# Patient Record
Sex: Female | Born: 1995 | Race: Black or African American | Hispanic: No | Marital: Single
Health system: Midwestern US, Academic
[De-identification: ages and names within clinical notes are randomized; demographics above are authoritative.]

---

## 2016-10-05 ENCOUNTER — Institutional Professional Consult (permissible substitution): Admit: 2016-10-05 | Payer: PRIVATE HEALTH INSURANCE

## 2016-10-05 DIAGNOSIS — Z9229 Personal history of other drug therapy: Secondary | ICD-10-CM

## 2016-10-27 ENCOUNTER — Ambulatory Visit: Admit: 2016-10-27 | Payer: PRIVATE HEALTH INSURANCE

## 2016-10-27 DIAGNOSIS — N926 Irregular menstruation, unspecified: Secondary | ICD-10-CM

## 2016-10-27 LAB — CHLAMYDIA/GONORRHOEAE DNA THIN PREP
Chlamydia Trachomatis DNA: NEGATIVE
Neisseria Gonorrhoeae DNA: NEGATIVE

## 2016-10-27 MED ORDER — loratadine (CLARITIN) 10 mg tablet
10 | ORAL_TABLET | Freq: Every day | ORAL | 5 refills | Status: AC
Start: 2016-10-27 — End: 2017-10-27

## 2016-10-27 MED ORDER — loratadine (CLARITIN) 10 mg tablet
10 | ORAL_TABLET | Freq: Every day | ORAL | 5 refills | Status: AC | PRN
Start: 2016-10-27 — End: 2016-10-27
  Filled 2016-10-27: qty 30, 30d supply, fill #0

## 2016-10-27 NOTE — Progress Notes (Signed)
WOMEN'S HEALTH ANNUAL EXAM     INITIAL ASSESSMENT      Chief Complaint   Patient presents with    Gynecologic Exam   :Here for annual gyn exam . Irregular periods . Missed period  Sexually active . Uses condoms most of the time . Has nasal stuffiness most of the time  No fever , No cough . No headache, no sinus tenderness. H/O anemia in the past    Nursing Assessment/Comment:    VITALS    BP 109/79   Pulse 75   Ht 5' 4 (1.626 m)   Wt 166 lb (75.3 kg)   LMP 09/20/2016   Social History   Substance Use Topics    Smoking status: Never Smoker    Smokeless tobacco: Never Used    Alcohol use Yes         SUBJECTIVE     Patient's last menstrual period was 09/20/2016.  Last PAP: ? 1  1/2 years ago normal   Menses Pattern: irregular    Usual Duration (days): 7    Yes [x]  No []   History of menorrhagia  Yes []  No [x]   History of intermenstrual spotting  Yes []  No [x]   History of vagina discharge  Yes [x]  No []   History of history of sexual activity     History of OCP use? no  Current contraception method(s):condoms    Breast Self-Exams:No    Yes [x]  No []   History of abdominal pain  Yes []  No [x]   History of urinary symptoms  Yes []  No [x]   History of vaginal irritation  Yes []  No [x]   History of pain with intercourse  Yes []  No [x]   History of history of colposcopy  Yes []  No [x]   History of family history of breast cancer  Yes []  No [x]   History of history of ovarian cancer  Yes []  No [x]   History of family history of blood clots  Yes []  No [x]   History of migraines with aura  Yes []  No [x]   History of migraines without aura    OBJECTIVE     Physical Exam  All Normal  GENERAL  [x]   Well-appearing      HEENT   Eyes:   [x]  anicteric [x]  normal pink conjunctiva .swollen nasal turbinates. Np sinus tenderness TMs clear .    Oropharynx:  [x]  clear      NECK  [x]  no lymphadenopathy [x]  no thyromegaly [x]  no masses   [x]  supple      CARDIAC  [x]  regular rate and rhythm without murmur      LUNGS  [x]  clear      ABDOMEN  [x]   soft; nontender; no hepatosplenomegaly; no masses      BREASTS  [x]  no masses, axillary lymphadenopathy, nipple discharge, or skin changes      EXTERNAL GENITALIA   [x]  normal external female genitalia without lesion      VAGINA  [x]  normal-appearing rugated vault without bleeding or unusual discharge present  []  Discharge  []  Malodor      CERVIX  [x]  Normal-appearing cervix without lesion  []  Friable  []  Ectropion  []  Polyp      BIMANUAL  Uterus: [x]   [place]; normal size and contour without tenderness. No CMT  Adnexae: []   Nontender; no masses; no CMT    []   Ovaries palpable with normal size and contour      RECTAL     []   No masses  []   No perianal lesion  Heme test:  []       SKIN  []  no rashes      Urine HCG negative    ASSESSMENT     1. Missed period  POC urine pregnancy    HCG Serum Qualitative [facility] Only   2. Encounter for annual routine gynecological examination  CBC    Comprehensive Metabolic Panel, Serum    TSH (Thyroid Stimulating Hormone)    Lipid Profile    Cytology - PAP Test, Routine Scrn    Iron Studies (Iron + TIBC)   3. Chronic seasonal allergic rhinitis due to other allergen  loratadine (CLARITIN) 10 mg tablet    DISCONTINUED: loratadine (CLARITIN) 10 mg tablet           PLAN     [x]  Encouraged monthly self-breast exams    []  Discussed calcium intake    []  Advised regular calcium intake    []  Advised regular exercise routine    [x]  Encouraged safe sex. Discussed at length, Offered BCpills . Pt deferred . Discussed Plan B . Discussed IUD and referral to Dr Loma Boston.  []  Contraception pamphlet given    []  Encouraged tobacco cessation    [x]  Discussed Gardasil vaccine. Pt has not had the vaccine, Wants to think about it      Return for fasting labs     Discussed provera for 10 days if serum pregnancy test was negative.

## 2016-11-02 ENCOUNTER — Ambulatory Visit: Admit: 2016-11-03 | Payer: PRIVATE HEALTH INSURANCE

## 2016-11-02 ENCOUNTER — Institutional Professional Consult (permissible substitution): Admit: 2016-11-02 | Payer: PRIVATE HEALTH INSURANCE

## 2016-11-02 DIAGNOSIS — Z01419 Encounter for gynecological examination (general) (routine) without abnormal findings: Secondary | ICD-10-CM

## 2016-11-02 LAB — CBC
Hematocrit: 41.8 % (ref 35.0–45.0)
Hemoglobin: 13.8 g/dL (ref 11.7–15.5)
MCH: 26.9 pg — ABNORMAL LOW (ref 27.0–33.0)
MCHC: 32.9 g/dL (ref 32.0–36.0)
MCV: 81.8 fL (ref 80.0–100.0)
MPV: 9.8 fL (ref 7.5–11.5)
Platelets: 206 10*3/uL (ref 140–400)
RBC: 5.11 10*6/uL — ABNORMAL HIGH (ref 3.80–5.10)
RDW: 15.4 % — ABNORMAL HIGH (ref 11.0–15.0)
WBC: 5.2 10*3/uL (ref 3.8–10.8)

## 2016-11-02 LAB — COMPREHENSIVE METABOLIC PANEL, SERUM
ALT: 17 U/L (ref 7–52)
AST (SGOT): 24 U/L (ref 13–39)
Albumin: 4.3 g/dL (ref 3.5–5.7)
Alkaline Phosphatase: 106 U/L (ref 36–125)
Anion Gap: 7 mmol/L (ref 3–16)
BUN: 12 mg/dL (ref 7–25)
CO2: 26 mmol/L (ref 21–33)
Calcium: 9.3 mg/dL (ref 8.6–10.3)
Chloride: 106 mmol/L (ref 98–110)
Creatinine: 0.96 mg/dL (ref 0.60–1.30)
Glucose: 85 mg/dL (ref 70–100)
Osmolality, Calculated: 287 mosm/kg (ref 278–305)
Potassium: 3.9 mmol/L (ref 3.5–5.3)
Sodium: 139 mmol/L (ref 133–146)
Total Bilirubin: 0.2 mg/dL (ref 0.0–1.5)
Total Protein: 7.3 g/dL (ref 6.4–8.9)
eGFR AA CKD-EPI: >90 See note.
eGFR NONAA CKD-EPI: 84 See note.

## 2016-11-02 LAB — LIPID PANEL
Cholesterol, Total: 137 mg/dL (ref 0–200)
HDL: 65 mg/dL (ref 60–92)
LDL Cholesterol: 66 mg/dL
Triglycerides: 32 mg/dL (ref 10–149)

## 2016-11-02 LAB — HCG, SERUM, QUALITATIVE: Preg, Serum: NEGATIVE

## 2016-11-02 LAB — IRON STUDIES
% Iron Saturation: 11.7 % — ABNORMAL LOW (ref 15.0–55.0)
Iron: 52 ug/dL (ref 50–212)
TIBC: 444 ug/dL (ref 265–497)

## 2016-11-02 LAB — TSH: TSH: 0.97 u[IU]/mL (ref 0.34–5.60)

## 2016-11-02 NOTE — Progress Notes (Signed)
Reports for fasting labs.

## 2016-11-03 NOTE — Progress Notes (Signed)
Iron low H and H normal

## 2016-11-12 NOTE — Unmapped (Signed)
From: Juliane Lack  Sent: 11/11/2016 2:15 PM EST  To: Mychart Error Pool  Subject: Hepatits B titer     Topic: Referral    Hello,    I am not sure who specifically to message but I put in an order for a Hepatitis B Titer for the same day that I did all of my other bloodwork and I don't seem to see those results (or don't realize which ones those are), how long does it take for it to come in?    Thank you

## 2016-12-04 ENCOUNTER — Institutional Professional Consult (permissible substitution): Admit: 2016-12-04 | Payer: PRIVATE HEALTH INSURANCE

## 2016-12-04 DIAGNOSIS — Z23 Encounter for immunization: Secondary | ICD-10-CM

## 2017-01-01 ENCOUNTER — Ambulatory Visit: Admit: 2017-01-02 | Payer: PRIVATE HEALTH INSURANCE

## 2017-01-01 ENCOUNTER — Institutional Professional Consult (permissible substitution): Admit: 2017-01-01 | Discharge: 2017-01-01 | Payer: PRIVATE HEALTH INSURANCE

## 2017-01-01 DIAGNOSIS — Z139 Encounter for screening, unspecified: Secondary | ICD-10-CM

## 2017-01-01 NOTE — Progress Notes (Signed)
Patient presents today for Hepatitis B titer

## 2017-01-02 LAB — HEPATITIS B SURFACE ANTIBODY, QUANTITATIVE
HBSAB NUMBER: >1000 m[IU]/mL — ABNORMAL HIGH (ref 0.00–7.99)
Hep B S Ab: REACTIVE — AB

## 2017-04-12 ENCOUNTER — Institutional Professional Consult (permissible substitution): Admit: 2017-04-12 | Payer: PRIVATE HEALTH INSURANCE

## 2017-04-12 ENCOUNTER — Ambulatory Visit: Admit: 2017-04-13 | Payer: PRIVATE HEALTH INSURANCE

## 2017-04-12 DIAGNOSIS — R7611 Nonspecific reaction to tuberculin skin test without active tuberculosis: Secondary | ICD-10-CM

## 2017-04-13 LAB — QUANTIFERON TB GOLD
QFT TB Ag minus Nil: 0.007 [IU]/mL
QuantiFERON Nil: 0.026 [IU]/mL
QuantiFERON TB Gold: NEGATIVE

## 2017-08-12 ENCOUNTER — Ambulatory Visit: Admit: 2017-08-12 | Payer: PRIVATE HEALTH INSURANCE

## 2017-08-12 DIAGNOSIS — Z3202 Encounter for pregnancy test, result negative: Secondary | ICD-10-CM

## 2017-08-12 MED ORDER — fluticasone (FLONASE) 50 mcg/actuation nasal spray
50 | Freq: Every day | NASAL | 2 refills | Status: AC
Start: 2017-08-12 — End: 2020-03-13
  Filled 2017-08-13: qty 16, 30d supply, fill #0

## 2017-08-12 MED ORDER — triamcinolone (KENALOG) 0.1 % cream
0.1 | Freq: Two times a day (BID) | TOPICAL | 1 refills | 1.00000 days | Status: AC
Start: 2017-08-12 — End: 2020-03-13
  Filled 2017-08-13: qty 30, 14d supply, fill #0

## 2017-08-12 NOTE — Unmapped (Signed)
Subjective   HPI:   Patient ID: Ann Schneider is a 21 y.o. female.    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  Also chronic, intermittent nasal congestion since moving to [place], did not try NS but would like to. No fever or sinus pain.               Medications:  Current Outpatient Prescriptions   Medication Sig Dispense Refill   ??? fluticasone (FLONASE) 50 mcg/actuation nasal spray Use 2 sprays into each nostril daily. 16 g 2   ??? loratadine (CLARITIN) 10 mg tablet Take 1 tablet (10 mg total) by mouth daily. 30 tablet 5   ??? triamcinolone (KENALOG) 0.1 % cream Apply topically 2 times a day. 30 g 1        ROS:   Review of Systems   Constitutional: Negative for chills and fever.   HENT: Positive for congestion. Negative for sinus pressure and sore throat.    Respiratory: Negative for cough.    Skin: Positive for rash.       Vitals:    08/12/17 1118   BP: 105/75   BP Location: Left arm   Patient Position: Sitting   BP Cuff Size: Regular   Pulse: 59   Resp: 14   Temp: 98.4 ??F (36.9 ??C)   TempSrc: Oral   Weight: 176 lb (79.8 kg)   Height: 5' 6 (1.676 m)     Body mass index is 28.41 kg/m??.  Body surface area is 1.93 meters squared.     Objective:     Physical Exam   Vitals reviewed.  Constitutional: She appears well-nourished. No distress.   HENT:   Right Ear: Tympanic membrane normal.   Left Ear: Tympanic membrane normal.   Nose: Mucosal edema present. Right sinus exhibits no maxillary sinus tenderness. Left sinus exhibits no maxillary sinus tenderness.   Eyes: Conjunctivae are normal.   Neck: Neck supple.   Cardiovascular: Normal rate and regular rhythm.    Pulmonary/Chest: Effort normal and breath sounds normal.   Lymphadenopathy:     She has no cervical adenopathy.   Skin:   Chronic rash appearing flexor area bilat arms                  Urine preg neg as last period was light.     Assessment/Plan:     1. Eczema, unspecified type   triamcinolone (KENALOG) 0.1 % cream    HCG Urine, Qualitative   2. Chronic seasonal allergic rhinitis, unspecified trigger  fluticasone (FLONASE) 50 mcg/actuation nasal spray     Trial TAC cream BID x 2-3 wk, consider derm if not better.  Flonase daily 2 RF.

## 2017-10-07 ENCOUNTER — Institutional Professional Consult (permissible substitution): Admit: 2017-10-07 | Payer: PRIVATE HEALTH INSURANCE

## 2017-10-07 DIAGNOSIS — Z23 Encounter for immunization: Secondary | ICD-10-CM

## 2017-10-07 NOTE — Unmapped (Signed)
Ann Schneider presents today for HPV 9, given in the LD using a 25g X 1inch needle, pt tolerated it well

## 2017-11-04 ENCOUNTER — Ambulatory Visit: Admit: 2017-11-05 | Payer: PRIVATE HEALTH INSURANCE

## 2017-11-04 ENCOUNTER — Ambulatory Visit: Admit: 2017-11-04 | Payer: PRIVATE HEALTH INSURANCE | Attending: Public Health & General Preventive Medicine

## 2017-11-04 DIAGNOSIS — Z01419 Encounter for gynecological examination (general) (routine) without abnormal findings: Secondary | ICD-10-CM

## 2017-11-04 DIAGNOSIS — L309 Dermatitis, unspecified: Secondary | ICD-10-CM

## 2017-11-04 DIAGNOSIS — R69 Illness, unspecified: Secondary | ICD-10-CM

## 2017-11-04 LAB — HPV HIGH RISK WITH GENOTYPING
HPV DNA High Risk Oth: NEGATIVE
HPV Genotype 16: NEGATIVE
HPV Genotype 18: NEGATIVE

## 2017-11-04 LAB — POCT URINE PREGNANCY: Preg Test, POC, Ur: NEGATIVE

## 2017-11-04 LAB — CHLAMYDIA/GONORRHOEAE DNA THIN PREP
Chlamydia Trachomatis DNA: NEGATIVE
Neisseria Gonorrhoeae DNA: NEGATIVE

## 2017-11-04 NOTE — Patient Instructions (Addendum)
1. Read information below, and call or email with questions.  2. Take all your medicine and follow all instructions.  3. Come back to [facility] if your symptoms do not get better &/or get worse.  4. KEEP YOUR DIARY of pain & other symptoms.  5. Come back in 1 month for further evaluation of your symptoms.      Nonspecific Chest Pain  Chest pain can be caused by many different conditions. There is always a chance that your pain could be related to something serious, such as a heart attack or a blood clot in your lungs. Chest pain can also be caused by conditions that are not life-threatening. If you have chest pain, it is very important to follow up with your health care provider.  What are the causes?  Causes of this condition include:   Heartburn.   Pneumonia or bronchitis.   Anxiety or stress.   Inflammation around your heart (pericarditis) or lung (pleuritis or pleurisy).   A blood clot in your lung.   A collapsed lung (pneumothorax). This can develop suddenly on its own (spontaneous pneumothorax) or from trauma to the chest.   Shingles infection (varicella-zoster virus).   Heart attack.   Damage to the bones, muscles, and cartilage that make up your chest wall. This can include:   Bruised bones due to injury.   Strained muscles or cartilage due to frequent or repeated coughing or overwork.   Fracture to one or more ribs.   Sore cartilage due to inflammation (costochondritis).  What increases the risk?  Risk factors for this condition may include:   Activities that increase your risk for trauma or injury to your chest.   Respiratory infections or conditions that cause frequent coughing.   Medical conditions or overeating that can cause heartburn.   Heart disease or family history of heart disease.   Conditions or health behaviors that increase your risk of developing a blood clot.   Having had chicken pox (varicella zoster).  What are the signs or symptoms?  Chest pain can feel like:   Burning or  tingling on the surface of your chest or deep in your chest.   Crushing, pressure, aching, or squeezing pain.   Dull or sharp pain that is worse when you move, cough, or take a deep breath.   Pain that is also felt in your back, neck, shoulder, or arm, or pain that spreads to any of these areas.  Your chest pain may come and go, or it may stay constant.  How is this diagnosed?  Lab tests or other studies may be needed to find the cause of your pain. Your health care provider may have you take a test called an ECG (electrocardiogram). An ECG records your heartbeat patterns at the time the test is performed. You may also have other tests, such as:   Transthoracic echocardiogram (TTE). In this test, sound waves are used to create a picture of the heart structures and to look at how blood flows through your heart.   Transesophageal echocardiogram (TEE).This is a more advanced imaging test that takes images from inside your body. It allows your health care provider to see your heart in finer detail.   Cardiac monitoring. This allows your health care provider to monitor your heart rate and rhythm in real time.   Holter monitor. This is a portable device that records your heartbeat and can help to diagnose abnormal heartbeats. It allows your health care provider to track your  heart activity for several days, if needed.   Stress tests. These can be done through exercise or by taking medicine that makes your heart beat more quickly.   Blood tests.   Other imaging tests.  How is this treated?  Treatment depends on what is causing your chest pain. Treatment may include:   Medicines. These may include:   Acid blockers for heartburn.   Anti-inflammatory medicine.   Pain medicine for inflammatory conditions.   Antibiotic medicine, if an infection is present.   Medicines to dissolve blood clots.   Medicines to treat coronary artery disease (CAD).   Supportive care for conditions that do not require medicines.  This may include:   Resting.   Applying heat or cold packs to injured areas.   Limiting activities until pain decreases.  Follow these instructions at home:  Medicines   If you were prescribed an antibiotic, take it as told by your health care provider. Do not stop taking the antibiotic even if you start to feel better.   Take over-the-counter and prescription medicines only as told by your health care provider.  Lifestyle   Do not use any products that contain nicotine or tobacco, such as cigarettes and e-cigarettes. If you need help quitting, ask your health care provider.   Do not drink alcohol.   Make lifestyle changes as directed by your health care provider. These may include:   Getting regular exercise. Ask your health care provider to suggest some activities that are safe for you.   Eating a heart-healthy diet. A registered dietitian can help you to learn healthy eating options.   Maintaining a healthy weight.   Managing diabetes, if necessary.   Reducing stress, such as with yoga or relaxation techniques.  General instructions   Avoid any activities that bring on chest pain.   If heartburn is the cause for your chest pain, raise (elevate) the head of your bed about 6 inches (15 cm) by putting blocks under the legs. Sleeping with more pillows does not effectively relieve heartburn because it only changes the position of your head.   Keep all follow-up visits as told by your health care provider. This is important. This includes any further testing if your chest pain does not go away.  Contact a health care provider if:   Your chest pain does not go away.   You have a rash with blisters on your chest.   You have a fever.   You have chills.  Get help right away if:   Your chest pain is worse.   You have a cough that gets worse, or you cough up blood.   You have severe pain in your abdomen.   You have severe weakness.   You faint.   You have sudden, unexplained chest discomfort.   You  have sudden, unexplained discomfort in your arms, back, neck, or jaw.   You have shortness of breath at any time.   You suddenly start to sweat, or your skin gets clammy.   You feel nauseous or you vomit.   You suddenly feel light-headed or dizzy.   Your heart begins to beat quickly, or it feels like it is skipping beats.  These symptoms may represent a serious problem that is an emergency. Do not wait to see if the symptoms will go away. Get medical help right away. Call your local emergency services (911 in the [place] Do not drive yourself to the hospital.  This information is not intended  to replace advice given to you by your health care provider. Make sure you discuss any questions you have with your health care provider.  Document Released: 08/19/2005 Document Revised: 08/03/2016 Document Reviewed: 08/03/2016  Elsevier Interactive Patient Education  2017 [employer].

## 2017-11-04 NOTE — Unmapped (Signed)
Subjective   HPI:   Patient ID: Ann Schneider is a 21 y.o. female.    Chief Complaint:  Gynecologic Exam   The patient's primary symptoms include missed menses. The patient's pertinent negatives include no genital itching, genital lesions, genital odor, genital rash, pelvic pain, vaginal bleeding or vaginal discharge. The patient is experiencing no pain. Associated symptoms include abdominal pain and headaches. Pertinent negatives include no anorexia, back pain, chills, constipation, diarrhea, discolored urine, dysuria, fever, flank pain, frequency, hematuria, joint pain, joint swelling, nausea, painful intercourse, rash, sore throat, urgency or vomiting. Associated symptoms comments: Last month, pt missed a period, took progesterone, then had a period.  During the 5 days or so of taking progesterone, she had abdominal pain, headaches, and felt lousy.  Wonders if she might have been pregnant.  Abdominal pain & headaches have been absent > about 2-3 weeks.. She is sexually active. Yes (Partner diagnosed with HPV.  Pt has never been tested; has gotten 1 HPV immunization; and has never had any genital lesions.), her partner has an STD. She uses condoms (Condoms ~ 90%; Withdrawal too.) for contraception. Her menstrual history has been irregular (Regular except for Oct/ Nov 2018.). Her past medical history is significant for an STD. There is no history of a Cesarean section, an ectopic pregnancy, endometriosis, a gynecological surgery, herpes simplex, menorrhagia, metrorrhagia, miscarriage, ovarian cysts, perineal abscess, PID, a terminated pregnancy or vaginosis. (H/o chlamydia)   Chest Pain    This is a new problem. The current episode started more than 1 month ago. The onset quality is undetermined. The problem occurs every several days. The problem has been unchanged (more frequent). The pain is present in the substernal region. The pain is at a severity of 7/10. The quality of the pain is described as dull and  pressure. The pain radiates to the left shoulder. Associated symptoms include abdominal pain, dizziness and headaches. Pertinent negatives include no back pain, cough, fever, nausea, numbness, palpitations, shortness of breath, vomiting or weakness. Associated symptoms comments: Stomach hurt at first, but not in the past few weeks.  Occurred during missed period.    One episode of dizziness 3 days ago; had to sit down.  Head-aches were happening.  .   Pertinent negatives for past medical history include no seizures. Past medical history comments: h/o chlamydia              Medications:  Current Outpatient Prescriptions   Medication Sig Dispense Refill   ??? fluticasone (FLONASE) 50 mcg/actuation nasal spray Use 2 sprays into each nostril daily. 16 g 2   ??? triamcinolone (KENALOG) 0.1 % cream Apply topically 2 times a day. 30 g 1        ROS:   Review of Systems   Constitutional: Negative for chills and fever.   HENT: Negative for sore throat.    Respiratory: Negative for cough and shortness of breath.    Cardiovascular: Positive for chest pain. Negative for palpitations and leg swelling.   Gastrointestinal: Positive for abdominal pain. Negative for abdominal distention, anorexia, constipation, diarrhea, heartburn, nausea and vomiting.   Genitourinary: Positive for menstrual problem and missed menses. Negative for dyspareunia, dysuria, flank pain, frequency, genital sores, hematuria, menorrhagia, pelvic pain, urgency and vaginal discharge.   Musculoskeletal: Negative for back pain and joint pain.        Upper L chest pain feels deeper than breast level, but could be muscle.   Skin: Negative for rash.   Neurological: Positive for dizziness  and headaches. Negative for tremors, seizures, syncope, speech difficulty, weakness and numbness.   Psychiatric/Behavioral: Positive for agitation, depression and sleep disturbance. Negative for self-injury and suicidal ideas. The patient is nervous/anxious.        Vitals:    11/04/17  1301   BP: 112/78   Pulse: 73   Temp: 97.6 ??F (36.4 ??C)   TempSrc: Oral   Weight: 172 lb (78 kg)   Height: 5' 4 (1.626 m)     Body mass index is 29.52 kg/m??.  Body surface area is 1.88 meters squared.     Objective:     Physical Exam   Nursing note and vitals reviewed.  Constitutional: Vital signs are normal. She appears well-developed and well-nourished. She is active and cooperative.  Non-toxic appearance. She does not have a sickly appearance. She does not appear ill. No distress.   HENT:   Head: Normocephalic and atraumatic.   Eyes: Conjunctivae and EOM are normal. Right eye exhibits no discharge. Left eye exhibits no discharge. No scleral icterus.   Neck: Normal range of motion. Neck supple. No thyromegaly present.   Pulmonary/Chest: Effort normal.   Abdominal: Soft. She exhibits no distension and no mass. There is no tenderness. There is no rebound and no guarding. Hernia confirmed negative in the right inguinal area and confirmed negative in the left inguinal area.   Genitourinary: Vagina normal and uterus normal. Rectal exam shows no external hemorrhoid. Pelvic exam was performed with patient in the knee-chest position. No labial fusion. There is no rash, tenderness, lesion or injury on the right labia. There is no rash, tenderness, lesion or injury on the left labia. Uterus is not deviated, not enlarged and not tender. Cervix exhibits no motion tenderness, no discharge and no friability. Right adnexum displays no mass, no tenderness and no fullness. Left adnexum displays no mass and no fullness. No erythema, tenderness or bleeding in the vagina. No foreign body in the vagina. No signs of injury around the vagina.   Genitourinary Comments: White mucoid vaginal discharge.  Sl. Tenderness L adnexa, without palpation of mass, enlargement/ organomegaly, erythema, or point tenderness.   Musculoskeletal: Normal range of motion. She exhibits no edema, tenderness or deformity.   Lymphadenopathy:        Right: No  inguinal adenopathy present.        Left: No inguinal adenopathy present.   Neurological: She is alert. She exhibits normal muscle tone. Coordination normal.   Skin: Skin is warm and dry. No rash noted. She is not diaphoretic. No erythema. No pallor.   Psychiatric: She has a normal mood and affect. Her behavior is normal. Judgment and thought content normal.           PHQ9 = > 8  GAD8 = 10            Assessment/Plan:     1. Gynecologic exam normal  Cytology - PAP Test, Routine Scrn    POC urine pregnancy    Syphilis Screening [facility])    HIV 1+2 Antibody/Antigen with Reflex    HCG Serum Qualitative [facility] Only    HPV vaccine 9 valent IM   2. Chest pain, unspecified type  EKG CLINIC ONLY   3. Overweight (BMI 25.0-29.9)     4. HPV exposure  Cytology - PAP Test, Routine Scrn    Syphilis Screening [facility])    HIV 1+2 Antibody/Antigen with Reflex    HPV vaccine 9 valent IM   5. Missed period  POC urine  pregnancy    HCG Serum Qualitative [facility] Only   6. Immunization due  HPV vaccine 9 valent IM   7. Anxiety         I have spent 55 minutes with the patient today, all of which were spent in face-to-face diagnosis, counseling, treatment, and coordination of care in regard to the problems as listed in the assessment and plan.      I explained the summary of the visit to the patient as documented in Patient Instructions.  Patient acknowledged understanding of encounter and follow-up.    Mason Jim, MD, DrPH

## 2017-11-04 NOTE — Unmapped (Signed)
Ann Schneider presents to the clinic today for a blood draw. Venipuncture was performed at the left anti cubital area and was successful on the first attempt.  Pt tolerated procedure well.

## 2017-11-04 NOTE — Unmapped (Signed)
12 lead EKG was performed on pt. and  Given to the MD. Pt tolerated procedure well.

## 2017-11-04 NOTE — Unmapped (Signed)
Ann Schneider presents at [facility] for HPV #2 vaccine, in a series of 3.   Pt was given a VIS (vaccine information statement).  Pt and I discussed med allergies and adverse reactions to previous immunizations.  Pt was asked if there is any chance of pregnancy and informed the CDC does not recommend HPV vaccine to pregnant women.  Pt states she has no allergies, no adverse reaction to previous immunizations and has no chance of pregnancy.   Pt was administered #2 HPV vaccine in her left Deltoid, IM, using a 25 gauge 1 inch needle.   Pt tolerated procedure well.

## 2017-11-05 LAB — HIV 1+2 ANTIBODY/ANTIGEN WITH REFLEX: HIV 1+2 AB/AGN: NONREACTIVE

## 2017-11-05 LAB — PREGNANCY QUALITATIVE, SERUM, UCMC ONLY: Preg, Serum: NEGATIVE

## 2017-11-05 LAB — TREPONEMA PALLIDUM AB WITH REFLEX: Treponema Pallidum: NEGATIVE

## 2017-12-02 ENCOUNTER — Ambulatory Visit: Admit: 2017-12-02 | Payer: PRIVATE HEALTH INSURANCE | Attending: Public Health & General Preventive Medicine

## 2017-12-02 ENCOUNTER — Ambulatory Visit: Admit: 2017-12-03 | Payer: PRIVATE HEALTH INSURANCE

## 2017-12-02 DIAGNOSIS — R1084 Generalized abdominal pain: Secondary | ICD-10-CM

## 2017-12-02 LAB — POCT URINALYSIS DIPSTICK, AUTOMATED W/O MICRO
POCT - Bilirubin, UA: NEGATIVE
POCT - Glucose, UA: NEGATIVE
POCT - Ketones, UA: NEGATIVE
POCT - Leukocytes Esterase, UA: NEGATIVE
POCT - Nitrite, UA: NEGATIVE
POCT - Specific Gravity, Urine: 1.015
POCT - Urobilinogen, UA: 0.2
POCT - pH, UA: 7

## 2017-12-02 LAB — COMPREHENSIVE METABOLIC PANEL, SERUM
ALT: 20 U/L (ref 7–52)
AST (SGOT): 29 U/L (ref 13–39)
Albumin: 4.6 g/dL (ref 3.5–5.7)
Alkaline Phosphatase: 99 U/L (ref 36–125)
Anion Gap: 10 mmol/L (ref 3–16)
BUN: 11 mg/dL (ref 7–25)
CO2: 27 mmol/L (ref 21–33)
Calcium: 9.9 mg/dL (ref 8.6–10.3)
Chloride: 103 mmol/L (ref 98–110)
Creatinine: 1 mg/dL (ref 0.60–1.30)
Glucose: 80 mg/dL (ref 70–100)
Osmolality, Calculated: 288 mOsm/kg (ref 278–305)
Potassium: 4.4 mmol/L (ref 3.5–5.3)
Sodium: 140 mmol/L (ref 133–146)
Total Bilirubin: 0.3 mg/dL (ref 0.0–1.5)
Total Protein: 7.9 g/dL (ref 6.4–8.9)
eGFR AA CKD-EPI: >90 See note.
eGFR NONAA CKD-EPI: 79 See note.

## 2017-12-02 LAB — HEPATIC FUNCTION PANEL
ALT: 20 U/L (ref 7–52)
AST: 29 U/L (ref 13–39)
Albumin: 4.6 g/dL (ref 3.5–5.7)
Alkaline Phosphatase: 99 U/L (ref 36–125)
Bilirubin, Direct: 0.08 mg/dL (ref 0.00–0.40)
Bilirubin, Indirect: 0.22 mg/dL (ref 0.00–1.10)
Total Bilirubin: 0.3 mg/dL (ref 0.0–1.5)
Total Protein: 7.9 g/dL (ref 6.4–8.9)

## 2017-12-02 LAB — POCT URINE PREGNANCY: Preg Test, POC, Ur: NEGATIVE

## 2017-12-02 MED ORDER — ibuprofen (ADVIL,MOTRIN) 200 MG tablet
200 | ORAL_TABLET | Freq: Four times a day (QID) | ORAL | 0 refills | Status: AC | PRN
Start: 2017-12-02 — End: 2017-12-17
  Filled 2017-12-02: qty 100, 13d supply, fill #0

## 2017-12-02 NOTE — Unmapped (Signed)
Subjective   HPI:   Patient ID: Ann Schneider is a 22 y.o. female.    Chief Complaint:  1. STOMACH PAIN  Stomach hurts: began several months ago.  Has it right now. Comes and goes.  Not assoc. With belching or flatulence.  BMs: sl. Increased - usually goes every 1-2 days.  Now: a little more frequent.  No pain. No sign of blood.  Pain is diffuse.  Sometimes reducing pressure, from clothing can alleviate.    Has gained weight - maybe 5 pounds.    TUMS, ASA, didn't make a difference.    Seems unrelated to diet. Can last 3 hours.  Happens daily.  *  2. Also menstrual cramping.  Menstrual cramps: most my life, but more now.    Sexually active, using condoms.  Used progesterone in the past.    Was late this month.  Passed heavier clots than usual.  ASA helps, but not completely.  Heating pad also helps.  Periods: sometimes passes clots.  *   3. PAP result showed changes in [place] consistent with BV.  No Sx (see ROS).  *   4. Chest pain has reduced - has occasional chest pain that goes away on its own.                     Medications:  Current Outpatient Prescriptions   Medication Sig Dispense Refill   ??? fluticasone (FLONASE) 50 mcg/actuation nasal spray Use 2 sprays into each nostril daily. 16 g 2   ??? triamcinolone (KENALOG) 0.1 % cream Apply topically 2 times a day. 30 g 1   ??? ibuprofen (ADVIL,MOTRIN) 200 MG tablet Take 2 tablets (400 mg total) by mouth every 6 hours as needed for Pain for up to 15 days. 120 tablet 0        ROS:   Review of Systems   Constitutional: Positive for weight gain. Negative for chills, diaphoresis, fever and weight loss.   HENT: Negative for congestion, ear pain and sore throat.    Eyes: Negative for pain.   Respiratory: Negative for cough, chest tightness and shortness of breath.    Cardiovascular: Positive for chest pain. Negative for palpitations.   Gastrointestinal: Positive for abdominal pain. Negative for abdominal distention, anal bleeding, bloating, blood in stool, constipation,  diarrhea, heartburn, nausea, rectal pain and vomiting.   Genitourinary: Positive for pelvic pain. Negative for dysuria, flank pain, frequency, genital sores, menstrual problem, urgency, vaginal bleeding, vaginal discharge and vaginal pain.        Menstrual cramping seems worsening.   Musculoskeletal: Negative for arthralgias, back pain, myalgias and neck pain.   Skin: Negative for color change, pallor, rash and wound.   Neurological: Negative for dizziness, tremors, syncope, weakness and headaches.   Hematological: Does not bruise/bleed easily.   Psychiatric/Behavioral: Negative for depression and dysphoric mood.       Vitals:    12/02/17 1258   BP: 107/54   Pulse: 66   Temp: 97.9 ??F (36.6 ??C)   TempSrc: Oral   Weight: 176 lb (79.8 kg)   Height: 5' 4 (1.626 m)     Body mass index is 30.21 kg/m??.  Body surface area is 1.9 meters squared.     Objective:     Physical Exam   Nursing note and vitals reviewed.  Constitutional: She appears well-developed and well-nourished. No distress.   HENT:   Head: Normocephalic and atraumatic.   Eyes: Conjunctivae and EOM are normal. Right eye exhibits  no discharge. Left eye exhibits no discharge. No scleral icterus.   Neck: Normal range of motion. Neck supple.   Pulmonary/Chest: Effort normal.   Abdominal: Soft. Bowel sounds are normal. She exhibits no distension and no mass. There is tenderness. There is no rebound and no guarding.   Tenderness greatest in RUQ.     Musculoskeletal: Normal range of motion. She exhibits no edema, tenderness or deformity.   Neurological: She is alert. She exhibits normal muscle tone. Coordination normal.   Skin: Skin is warm and dry. No rash noted. She is not diaphoretic. No erythema. No pallor.   Psychiatric: She has a normal mood and affect. Her behavior is normal. Judgment and thought content normal.         Urine preg test: Neg.  Urinalysis: BLO 3+ and PRO 1+ (ending period now)              Assessment/Plan:     1. Generalized abdominal pain  POCT  Urinalysis Dipstick, automated w/o micro    POC urine pregnancy    Comprehensive Metabolic Panel, Serum    Hepatic Function Panel    ibuprofen (ADVIL,MOTRIN) 200 MG tablet   2. Menstrual cramps  POCT Urinalysis Dipstick, automated w/o micro    POC urine pregnancy    ibuprofen (ADVIL,MOTRIN) 200 MG tablet   3. Obesity (BMI 30.0-34.9)     4. Chest pain, unspecified type         I have spent 50 minutes with the patient today, all of which were spent in face-to-face diagnosis, counseling, treatment, and coordination of care in regard to the problems as listed in the assessment and plan.      I explained the summary of the visit to the patient as documented in Patient Instructions.  Patient acknowledged understanding of encounter and follow-up.    Mason Jim, MD, DrPH

## 2017-12-02 NOTE — Unmapped (Signed)
1. Read information below, and call or email with questions.  2. Take all your medicine and follow all instructions.  3. Come back to [facility] if your symptoms do not get better &/or get worse.  4. Keep a diary of your diet, activity, and abdominal pain.  Come back in 2-4 weeks to discuss.      Abdominal Pain, Adult  Abdominal pain can be caused by many things. Often, abdominal pain is not serious and it gets better with no treatment or by being treated at home. However, sometimes abdominal pain is serious. Your health care provider will do a medical history and a physical exam to try to determine the cause of your abdominal pain.  Follow these instructions at home:  ?? Take over-the-counter and prescription medicines only as told by your health care provider. Do not take a laxative unless told by your health care provider.  ?? Drink enough fluid to keep your urine clear or pale yellow.  ?? Watch your condition for any changes.  ?? Keep all follow-up visits as told by your health care provider. This is important.  Contact a health care provider if:  ?? Your abdominal pain changes or gets worse.  ?? You are not hungry or you lose weight without trying.  ?? You are constipated or have diarrhea for more than 2-3 days.  ?? You have pain when you urinate or have a bowel movement.  ?? Your abdominal pain wakes you up at night.  ?? Your pain gets worse with meals, after eating, or with certain foods.  ?? You are throwing up and cannot keep anything down.  ?? You have a fever.  Get help right away if:  ?? Your pain does not go away as soon as your health care provider told you to expect.  ?? You cannot stop throwing up.  ?? Your pain is only in areas of the abdomen, such as the right side or the left lower portion of the abdomen.  ?? You have bloody or black stools, or stools that look like tar.  ?? You have severe pain, cramping, or bloating in your abdomen.  ?? You have signs of dehydration, such as:  ?? Dark urine, very little urine, or no  urine.  ?? Cracked lips.  ?? Dry mouth.  ?? Sunken eyes.  ?? Sleepiness.  ?? Weakness.  This information is not intended to replace advice given to you by your health care provider. Make sure you discuss any questions you have with your health care provider.  Document Released: 08/19/2005 Document Revised: 05/29/2016 Document Reviewed: 04/22/2016  Elsevier Interactive Patient Education ?? 2017 [employer].

## 2018-02-21 ENCOUNTER — Institutional Professional Consult (permissible substitution): Admit: 2018-02-21 | Payer: PRIVATE HEALTH INSURANCE

## 2018-02-21 DIAGNOSIS — Z139 Encounter for screening, unspecified: Secondary | ICD-10-CM

## 2018-02-21 NOTE — Progress Notes (Signed)
Ann Schneider presents today PPD skin test.

## 2018-02-24 ENCOUNTER — Institutional Professional Consult (permissible substitution): Admit: 2018-02-24 | Payer: PRIVATE HEALTH INSURANCE

## 2018-02-24 DIAGNOSIS — Z111 Encounter for screening for respiratory tuberculosis: Secondary | ICD-10-CM

## 2018-02-24 NOTE — Progress Notes (Signed)
0mm induration. NEGATIVE read.

## 2019-12-02 MED ORDER — COVID-19 vacc,mRNA,Moderna,-PF 100 mcg/0.5 mL Susp
100 | INTRAMUSCULAR | 1 refills | 21.00000 days | Status: AC
Start: 2019-12-02 — End: ?

## 2019-12-04 MED FILL — COVID-19 VACCINE,MRNA,CX024414(MODERNA)(PF)100 MCG/0.5 ML IM SUSP: 100 100 mcg/0.5 mL | INTRAMUSCULAR | 28 days supply | Qty: 0.5 | Fill #0

## 2019-12-30 ENCOUNTER — Ambulatory Visit: Admit: 2019-12-30 | Payer: PRIVATE HEALTH INSURANCE

## 2020-01-01 MED FILL — COVID-19 VACCINE,MRNA,CX024414(MODERNA)(PF)100 MCG/0.5 ML IM SUSP: 100 100 mcg/0.5 mL | INTRAMUSCULAR | 28 days supply | Qty: 0.5 | Fill #1

## 2020-03-13 ENCOUNTER — Other Ambulatory Visit: Admit: 2020-03-14 | Payer: PRIVATE HEALTH INSURANCE

## 2020-03-13 ENCOUNTER — Ambulatory Visit: Admit: 2020-03-13 | Discharge: 2020-03-13 | Payer: PRIVATE HEALTH INSURANCE | Attending: Adult Health

## 2020-03-13 DIAGNOSIS — Z01419 Encounter for gynecological examination (general) (routine) without abnormal findings: Secondary | ICD-10-CM

## 2020-03-13 DIAGNOSIS — R3915 Urgency of urination: Secondary | ICD-10-CM

## 2020-03-13 LAB — POCT URINALYSIS DIPSTICK, AUTOMATED W/O MICRO
POCT - Bilirubin, UA: NEGATIVE
POCT - Blood, UA: NEGATIVE
POCT - Glucose, UA: NEGATIVE
POCT - Ketones, UA: NEGATIVE
POCT - Leukocytes Esterase, UA: NEGATIVE
POCT - Nitrite, UA: NEGATIVE
POCT - Protein, UA: NEGATIVE
POCT - Specific Gravity, Urine: 1.015
POCT - Urobilinogen, UA: 0.2
POCT - pH, UA: 7

## 2020-03-13 LAB — CHLAMYDIA/GONORRHOEAE DNA THIN PREP
Chlamydia Trachomatis DNA: NEGATIVE
Neisseria Gonorrhoeae DNA: NEGATIVE

## 2020-03-13 LAB — URINE CULTURE, OUTPATIENT: Culture Result: <1000

## 2020-03-13 NOTE — Unmapped (Signed)
Subjective   HPI:   Patient ID: Ann Schneider is a 24 y.o. female.    Chief Complaint:  Chief Complaint   Patient presents with   ??? Gynecologic Exam     pap   ??? Urinary Tract Infection     urgenccy to urinate x 2 weeks   ??? Constipation   ??? Insomnia     falls asleep fine but cant stay asleep   ??? 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 Patient presents for annual women???s health exam.  Current complaint of urinary urgency x 2 weeks, and recent constipation.  Urinary Urgency -  urinates frequently. Occasional burning with urination.  Drinking more water in recent months. No hx UTI.  Constipation - intermittent  Hx anemia - stopped taking PN MV, has been felling tired. Periods are irregular and heavy bleeding for first few days.  Trouble sleeping thru night - falls asleep, but has frequent periods of waking, sometimes d/t urination  SA with female partner; using condoms for contraception/ disease prevention.   Menses: Irregular, Moderate to Heavy bleeding, every 30-35 days, occasionally skips months, duration 6-8 days.  Patient's last menstrual period was 03/04/2020.  Last [facility] exam with PAP 10/2017 - PAP normal.  [id number]  Immunizations: HPV vaccines x 2 doses  Denies SOB and dizziness on her menses.   Denies personal and FH of hypertension, DM, bleeding/ clotting disorders, gall bladder/ kidney/ liver disease, and breast/ ovarian/ cervical cancers.   Denies recent fever/chills, abdominal pain, chest pain, SOB, severe headaches, severe leg pain, breast tenderness, weight/appetite change, nausea/vomiting.   Denies history of STIs. Denies current complaints of vaginal rashes/ lesions/ discharge/ itchiness/ pain.          Medications:  Current Outpatient Medications   Medication Sig Dispense Refill   ??? COVID-19 vacc,mRNA,Moderna,-PF 100 mcg/0.5 mL Susp Inject 0.5 mLs into the muscle every 28 days. 0.5 mL 1        ROS:   Review of Systems   Constitutional: Positive for fatigue. Negative for chills and fever.   Respiratory:  Negative for cough, chest tightness and shortness of breath.    Cardiovascular: Negative for chest pain.   Gastrointestinal: Positive for constipation. Negative for abdominal pain and nausea.   Genitourinary: Positive for dysuria and urgency. Negative for dyspareunia, vaginal bleeding, vaginal discharge and vaginal pain.   Skin: Negative for rash.   Neurological: Negative for dizziness and headaches.   Psychiatric/Behavioral: Positive for sleep disturbance.   All other systems reviewed and are negative.      Vitals:    03/13/20 1711 03/13/20 1721   BP: (!) 87/56 109/76   BP Location: Left arm Right arm   BP Cuff Size: Regular Regular   Pulse: 76 81   Temp: 97.3 ??F (36.3 ??C)    TempSrc: Temporal    SpO2: 98%    Weight: 176 lb (79.8 kg)    Height: 5' 4 (1.626 m)      Body mass index is 30.21 kg/m??.  Body surface area is 1.9 meters squared.     Objective:     Physical Exam  Vitals signs reviewed.   Constitutional:       General: She is not in acute distress.  Chest:      Breasts: Breasts are symmetrical.         Right: No inverted nipple, mass, skin change or tenderness.         Left: No inverted  nipple, mass, skin change or tenderness.   Abdominal:      Palpations: Abdomen is soft.      Tenderness: There is no abdominal tenderness.   Genitourinary:     Exam position: Knee-chest position.      Pubic Area: No rash.       Labia:         Right: No tenderness or lesion.         Left: No tenderness or lesion.       Vagina: No vaginal discharge, tenderness or bleeding.      Cervix: No cervical motion tenderness, discharge, friability or erythema.      Adnexa: Right adnexa normal and left adnexa normal.   Lymphadenopathy:      Upper Body:      Right upper body: No axillary or pectoral adenopathy.      Left upper body: No axillary or pectoral adenopathy.   Skin:     General: Skin is warm and dry.      Findings: No rash.   Neurological:      Mental Status: She is alert and oriented to person, place, and time.                  Assessment/Plan:     1. Encounter for routine gynecological examination with Papanicolaou smear of cervix  Cytology-ThinPrep Pap Test   2. Urinary urgency  POCT Urinalysis Dipstick, automated w/o micro    Urine Culture, Outpatient ([facility])   3. Constipation, unspecified constipation type     4. Hx of iron deficiency anemia  CBC    Differential    Comprehensive Metabolic Panel, Serum    Iron Studies (Iron + TIBC)    TSH (Thyroid Stimulating Hormone)    Lipid Profile     Labs ordered to be drawn fasting 8-10 hrs.  PAP pending results 5-7 days, will call if abnormal.  Urine macro normal - will send micro/C/S.  Discussed sleep hygiene, fiber for constipation, guidance for good health.  Follow-up as needed or pending lab results. Recommend physical to address sleep, hx anemia, constipation.  Next [facility] exam in 3 yrs if this PAP is normal.       Patient counseled regarding diagnosis, assessment and plan, use and side effects of medications. All questions answered.  Total time for E/M services on the date of the encounter: LOSMDM EST3   25  minutes.

## 2020-03-13 NOTE — Unmapped (Signed)
PAP results 5-7 days - will call if abnormal.  Urinalysis - normal.   For your fasting lab orders: fast 8-10 hours, you may have labs collected at any [facility] laboratory.  Guidance for good health:  Plenty of water daily 6-8 glasses.  Well-balanced meals and snacks - include lean proteins, fresh fruits/vegetables, whole grains, fiber.  Include a fiber supplement daily if you continue to have constipation - Metamucil, or other OTC fiber supplement  Multivitamin, Calcium and Vit D3 supplement daily.  Exercise 30-60 minutes 4-5 times weekly.   Periodic breast self-exams  Sleep hygiene:  Try to sleep 7-9 hrs/night. Establish set bedtimes and wake-up times.   Avoid loud noise and bright lights (tv, computers, phone) at least one hr before bedtime.   Avoid late night meals and snacks.   Limit caffeine to one cup daily.  Try melatonin 3-5 mg OTC supplement as directed, or benadryl 25 mg as needed.    Recommend scheduling a complete physical.  Follow-up as needed or one year.for next well-woman exam. Next PAP in 3 years if normal.
